# Patient Record
Sex: Female | Born: 1975 | Race: White | Hispanic: No | Marital: Married
Health system: Southern US, Community
[De-identification: ages and names within clinical notes are randomized; demographics above are authoritative.]

---

## 2016-08-16 DIAGNOSIS — R001 Bradycardia, unspecified: Secondary | ICD-10-CM | POA: Insufficient documentation

## 2016-08-16 DIAGNOSIS — F411 Generalized anxiety disorder: Secondary | ICD-10-CM | POA: Insufficient documentation

## 2016-09-26 DIAGNOSIS — E669 Obesity, unspecified: Secondary | ICD-10-CM | POA: Insufficient documentation

## 2021-01-05 NOTE — Progress Notes (Signed)
[place] Phone: [phone] Subjective:   Rebecca [name], am serving as a scribe for Dr. Hulan Saas. This visit occurred during the SARS-CoV-2 public health emergency.  Safety protocols were in place, including screening questions prior to the visit, additional usage of staff PPE, and extensive cleaning of exam room while observing appropriate contact time as indicated for disinfecting solutions.   I'm seeing this patient by the request  of:  Katherina Mires, MD  CC: Right knee pain  [id number]  Rebecca [name] is a 45 y.o. female coming in with complaint of R knee and toe pain. States that she has history of R femur fx 20 years ago. Does have rod in femur. Has tried PRP injection one year ago but this did not help. Steroid or gel injections have also not provided any relief. Patient has also tried custom OA braces. Pain over medial aspect. Patient is a Physicist, medical. Patient uses Motrin on long runs.    Patient also having pain over 2nd and 3rd met head. Patient saw PCP and they discussed possible Morton's Neuroma.     [name] past medical history on file.  Social History   Socioeconomic History   Marital status: Married    Spouse name: Not on file   Number of children: Not on file   Years of education: Not on file   Highest education level: Not on file  Occupational History   Not on file  Tobacco Use   Smoking status: Not on file   Smokeless tobacco: Not on file  Substance and Sexual Activity   Alcohol use: Not on file   Drug use: Not on file   Sexual activity: Not on file  Other Topics Concern   Not on file  Social History Narrative   Not on file   Social Determinants of Health   Financial Resource Strain: Not on file  Food Insecurity: Not on file  Transportation Needs: Not on file  Physical Activity: Not on file  Stress: Not on file  Social Connections: Not on file   Not on File [name] family  history on file.       Current Outpatient Medications (Other):    escitalopram (LEXAPRO) 20 MG tablet, Take 20 mg by mouth daily.   Vitamin D, Cholecalciferol, 10 MCG (400 UNIT) CAPS, Take by mouth.   Reviewed prior external information including notes and imaging from  primary care provider As well as notes that were available from care everywhere and other healthcare systems.  Past medical history, social, surgical and family history all reviewed in electronic medical record.  [name] pertanent information unless stated regarding to the chief complaint.   Review of Systems:  [name] headache, visual changes, nausea, vomiting, diarrhea, constipation, dizziness, abdominal pain, skin rash, fevers, chills, night sweats, weight loss, swollen lymph nodes, body aches, joint swelling, chest pain, shortness of breath, mood changes. POSITIVE muscle aches  Objective  Blood pressure 118/74, pulse 83, height '5\' 5"'  (1.651 m), weight 168 lb (76.2 kg), last menstrual period 12/23/2020, SpO2 98 %.   General: [name] apparent distress alert and oriented x3 mood and affect normal, dressed appropriately.  HEENT: Pupils equal, extraocular movements intact  Respiratory: Patient's speak in full sentences and does not appear short of breath  Cardiovascular: [name] lower extremity edema, non tender, [name] erythema  Gait normal with good balance and coordination.  MSK: Right knee exam shows the patient does have mild lateral tracking  of the patella noted.  Patient actually has fairly good stability.  More tenderness over the femur itself.  [name] erythema, trace effusion noted the patellofemoral joint.  Foot exam shows the patient does have significant breakdown of the transverse arch.  Patient does have splaying between the second and third toes.  Relatively normal longitudinal foot.  Very slight overpronation noted.  Limited muscular skeletal ultrasound was performed and interpreted by Hulan Saas, M  Limited musculoskeletal  ultrasound shows that patient's distal femur on the lateral aspect does have what appears to be postsurgical changes noted.  Mild hypoechoic changes in this area underneath the muscle.  Patient does have mild to moderate narrowing of the patellofemoral joint with trace effusion noted.  Patient is some medial and lateral space though seems to be unremarkable with very mild arthritic changes.  [name] significant acute meniscal injury is noted. 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

## 2021-01-06 ENCOUNTER — Ambulatory Visit (INDEPENDENT_AMBULATORY_CARE_PROVIDER_SITE_OTHER): Payer: BC Managed Care – PPO

## 2021-01-06 ENCOUNTER — Other Ambulatory Visit: Payer: Self-pay

## 2021-01-06 ENCOUNTER — Ambulatory Visit (INDEPENDENT_AMBULATORY_CARE_PROVIDER_SITE_OTHER): Payer: BC Managed Care – PPO | Admitting: Family Medicine

## 2021-01-06 ENCOUNTER — Other Ambulatory Visit: Payer: Self-pay | Admitting: Family Medicine

## 2021-01-06 VITALS — BP 118/74 | HR 83 | Ht 65.0 in | Wt 168.0 lb

## 2021-01-06 DIAGNOSIS — M216X9 Other acquired deformities of unspecified foot: Secondary | ICD-10-CM | POA: Diagnosis not present

## 2021-01-06 DIAGNOSIS — M1711 Unilateral primary osteoarthritis, right knee: Secondary | ICD-10-CM

## 2021-01-06 DIAGNOSIS — M25561 Pain in right knee: Secondary | ICD-10-CM

## 2021-01-06 NOTE — Patient Instructions (Signed)
Thigh compression sleeve BODY [email or web address] Tru pull lite R knee Spenco Total Support HOKA Arahi shoes OOFOS in house Vit D 2000IU daily See me again in 6-8 weeks

## 2021-01-07 ENCOUNTER — Encounter: Payer: Self-pay | Admitting: Family Medicine

## 2021-01-07 DIAGNOSIS — M1711 Unilateral primary osteoarthritis, right knee: Secondary | ICD-10-CM | POA: Insufficient documentation

## 2021-01-07 DIAGNOSIS — M216X9 Other acquired deformities of unspecified foot: Secondary | ICD-10-CM | POA: Insufficient documentation

## 2021-01-07 NOTE — Assessment & Plan Note (Signed)
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  Discussed icing regimen and home exercises, discussed avoiding certain activities.  Patient given a Tru pull lite brace for more stability of the patellofemoral being that this seems to be the most consistent with patient's pattern.  Concern now that there is some potential abnormality noted of the femur rod patient has had for years.  Discussed thigh compression, home exercises, proper shoes.  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  Follow-up with me again in 6 to 8 weeks

## 2021-01-07 NOTE — Assessment & Plan Note (Signed)
Breakdown of the transverse arch bilaterally.  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  Discussed potential medications including gabapentin as well as we may need to consider the potential for injections of follow-up.  Patient will start with over-the-counter orthotics, different shoes for running.  We will see how patient responds and follow-up again in 6 weeks

## 2021-02-17 ENCOUNTER — Ambulatory Visit: Payer: BC Managed Care – PPO | Admitting: Family Medicine

## 2021-03-06 NOTE — Progress Notes (Signed)
[name] [address] [place] [address] Phone: [phone] Subjective:   Rebecca [name], am serving as a scribe for Dr. Antoine Primas. This visit occurred during the SARS-CoV-2 public health emergency.  Safety protocols were in place, including screening questions prior to the visit, additional usage of staff PPE, and extensive cleaning of exam room while observing appropriate contact time as indicated for disinfecting solutions.   I'm seeing this patient by the request  of:  Macy Mis, MD  CC: Knee pain and foot pain  [id number]  01/06/2021 Breakdown of the transverse arch bilaterally.  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  Discussed potential medications including gabapentin as well as we may need to consider the potential for injections of follow-up.  Patient will start with over-the-counter orthotics, different shoes for running.  We will see how patient responds and follow-up again in 6 weeks  Update 03/07/2021 Rebecca [name] is a 45 y.o. female coming in with complaint of R knee and R foot pain. Patient states that her foot is improving. Is able to run further but does still experience pain. Using HOKA and orthotics. Pain is burning in nature when she has pain.   Wearing brace for knee pain. Continued soreness on medial aspect. Has not been able to do HEP as much due to husband breaking his foot.       No past medical history on file. No past surgical history on file. Social History   Socioeconomic History   Marital status: Married    Spouse name: Not on file   Number of children: Not on file   Years of education: Not on file   Highest education level: Not on file  Occupational History   Not on file  Tobacco Use   Smoking status: Not on file   Smokeless tobacco: Not on file  Substance and Sexual Activity   Alcohol use: Not on file   Drug use: Not on file   Sexual  activity: Not on file  Other Topics Concern   Not on file  Social History Narrative   Not on file   Social Determinants of Health   Financial Resource Strain: Not on file  Food Insecurity: Not on file  Transportation Needs: Not on file  Physical Activity: Not on file  Stress: Not on file  Social Connections: Not on file   Not on File No family history on file.       Current Outpatient Medications (Other):    escitalopram (LEXAPRO) 20 MG tablet, Take 20 mg by mouth daily.   Vitamin D, Cholecalciferol, 10 MCG (400 UNIT) CAPS, Take by mouth.   Reviewed prior external information including notes and imaging from  primary care provider As well as notes that were available from care everywhere and other healthcare systems.  Past medical history, social, surgical and family history all reviewed in electronic medical record.  No pertanent information unless stated regarding to the chief complaint.   Review of Systems:  No headache, visual changes, nausea, vomiting, diarrhea, constipation, dizziness, abdominal pain, skin rash, fevers, chills, night sweats, weight loss, swollen lymph nodes, body aches, joint swelling, chest pain, shortness of breath, mood changes. POSITIVE muscle aches  Objective  Blood pressure 104/72, pulse 65, height 5\' 5"  (1.651 m), weight 165 lb (74.8 kg), SpO2 97 %.   General: No apparent distress alert and oriented x3 mood and affect normal, dressed appropriately.  HEENT: Pupils equal, extraocular movements intact  Respiratory: Patient's speak in full sentences and does not appear short of breath  Cardiovascular: No lower extremity edema, non tender, no erythema  Gait normal with good balance and coordination.  MSK: Right knee exam shows very slight lateral tracking of the patella noted.  Patient does have very mild pain over this area.  No significant instability noted of the knee. Foot exam does show the breakdown of the transverse arch.  Patient has had  the squeeze test noted.   Limited muscular skeletal ultrasound was performed and interpreted by Antoine Primas, M  Limited musculoskeletal ultrasound shows patient does have trace effusion noted of the patellofemoral joint and mild to moderate narrowing noted.   Impression and Recommendations:     The above documentation has been reviewed and is accurate and complete Judi Saa, DO

## 2021-03-07 ENCOUNTER — Ambulatory Visit (INDEPENDENT_AMBULATORY_CARE_PROVIDER_SITE_OTHER): Payer: BC Managed Care – PPO | Admitting: Family Medicine

## 2021-03-07 ENCOUNTER — Encounter: Payer: Self-pay | Admitting: Family Medicine

## 2021-03-07 ENCOUNTER — Other Ambulatory Visit: Payer: Self-pay

## 2021-03-07 ENCOUNTER — Ambulatory Visit: Payer: Self-pay

## 2021-03-07 VITALS — BP 104/72 | HR 65 | Ht 65.0 in | Wt 165.0 lb

## 2021-03-07 DIAGNOSIS — M216X9 Other acquired deformities of unspecified foot: Secondary | ICD-10-CM

## 2021-03-07 DIAGNOSIS — M1711 Unilateral primary osteoarthritis, right knee: Secondary | ICD-10-CM | POA: Diagnosis not present

## 2021-03-07 DIAGNOSIS — M79671 Pain in right foot: Secondary | ICD-10-CM

## 2021-03-07 NOTE — Patient Instructions (Signed)
Foot exercises Knee is better See me in 2-3 months

## 2021-03-07 NOTE — Assessment & Plan Note (Signed)
Patient is doing much better overall at this time.  Only a trace effusion noted on the ultrasound today.  I believe patient will continue to do well with conservative therapy.  Can wear the brace more on an as-needed basis.  Patient's goal is to be able to continue the a half marathon at the age of 41.  Patient will follow-up with me again in 2 to 3 months

## 2021-03-07 NOTE — Assessment & Plan Note (Signed)
Patient given exercises today and I think will do relatively well.  Discussed home exercises and proper shoes again.  Patient has made improvement.  Patient is not working out as much secondary to her husband being injured recently.  Follow-up with me again 2 to 3 months and will progress patient as tolerated.

## 2021-05-11 NOTE — Progress Notes (Signed)
[place] Phone: [phone] Subjective:   Fontaine No, am serving as a scribe for Dr. Hulan Saas. This visit occurred during the SARS-CoV-2 public health emergency.  Safety protocols were in place, including screening questions prior to the visit, additional usage of staff PPE, and extensive cleaning of exam room while observing appropriate contact time as indicated for disinfecting solutions.  I'm seeing this patient by the request  of:  Katherina Mires, MD  CC: Left shoulder pain  [id number]  Florette Colaianni is a 46 y.o. female coming in with complaint of L shoulder pain. Last seen in November for foot pain. Patient states that she has pain in L shoulder with taking off bra and shirts for past 2 months. Rest for pain relief.      No past medical history on file. No past surgical history on file. Social History   Socioeconomic History   Marital status: Married    Spouse name: Not on file   Number of children: Not on file   Years of education: Not on file   Highest education level: Not on file  Occupational History   Not on file  Tobacco Use   Smoking status: Not on file   Smokeless tobacco: Not on file  Substance and Sexual Activity   Alcohol use: Not on file   Drug use: Not on file   Sexual activity: Not on file  Other Topics Concern   Not on file  Social History Narrative   Not on file   Social Determinants of Health   Financial Resource Strain: Not on file  Food Insecurity: Not on file  Transportation Needs: Not on file  Physical Activity: Not on file  Stress: Not on file  Social Connections: Not on file   Not on File No family history on file.       Current Outpatient Medications (Other):    escitalopram (LEXAPRO) 20 MG tablet, Take 20 mg by mouth daily.   Vitamin D, Cholecalciferol, 10 MCG (400 UNIT) CAPS, Take by mouth.   Reviewed prior external information including  notes and imaging from  primary care provider As well as notes that were available from care everywhere and other healthcare systems.  Past medical history, social, surgical and family history all reviewed in electronic medical record.  No pertanent information unless stated regarding to the chief complaint.   Review of Systems:  No headache, visual changes, nausea, vomiting, diarrhea, constipation, dizziness, abdominal pain, skin rash, fevers, chills, night sweats, weight loss, swollen lymph nodes, body aches, joint swelling, chest pain, shortness of breath, mood changes. POSITIVE muscle aches  Objective  Blood pressure 124/82, pulse 87, height 5\' 5"  (1.651 m), weight 182 lb (82.6 kg), last menstrual period 05/02/2021, SpO2 99 %.   General: No apparent distress alert and oriented x3 mood and affect normal, dressed appropriately.  HEENT: Pupils equal, extraocular movements intact  Respiratory: Patient's speak in full sentences and does not appear short of breath  Cardiovascular: No lower extremity edema, non tender, no erythema  Left shoulder exam does have some loss of lordosis.  Some tenderness to palpation in the paraspinal musculature.  Patient does have bone pain with crossover test.  Mild pain with O'Brien's.  Patient does have good range of motion of the shoulder noted.  5 out of 5 strength of the rotator cuff.  Limited muscular skeletal ultrasound was performed and interpreted by Hulan Saas, M  Limited  ultrasound of patient's left shoulder shows the patient's rotator cuff does appear to be intact.  Mild hypoechoic changes that is consistent with tendinitis of the supraspinatus and the subscapularis.  Patient does have hypoechoic changes and does have narrowing of the acromioclavicular joint noted. 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  [id number]; 15 additional minutes spent for Therapeutic exercises as stated in above notes.  This included exercises focusing on  stretching, strengthening, with significant focus on eccentric aspects.   Long term goals include an improvement in range of motion, strength, endurance as well as avoiding reinjury. Patient's frequency would include in 1-2 times a day, 3-5 times a week for a duration of 6-12 weeks.  Shoulder Exercises that included:  Basic scapular stabilization to include adduction and depression of scapula Scaption, focusing on proper movement and good control Internal and External rotation utilizing a theraband, with elbow tucked at side entire time Rows with theraband  Proper technique shown and discussed handout in great detail with ATC.  All questions were discussed and answered.      Impression and Recommendations:     The above documentation has been reviewed and is accurate and complete Lyndal Pulley, DO

## 2021-05-12 ENCOUNTER — Ambulatory Visit: Payer: Self-pay

## 2021-05-12 ENCOUNTER — Encounter: Payer: Self-pay | Admitting: Family Medicine

## 2021-05-12 ENCOUNTER — Ambulatory Visit (INDEPENDENT_AMBULATORY_CARE_PROVIDER_SITE_OTHER): Payer: BC Managed Care – PPO | Admitting: Family Medicine

## 2021-05-12 ENCOUNTER — Other Ambulatory Visit: Payer: Self-pay

## 2021-05-12 ENCOUNTER — Ambulatory Visit (INDEPENDENT_AMBULATORY_CARE_PROVIDER_SITE_OTHER): Payer: BC Managed Care – PPO

## 2021-05-12 VITALS — BP 124/82 | HR 87 | Ht 65.0 in | Wt 182.0 lb

## 2021-05-12 DIAGNOSIS — M19012 Primary osteoarthritis, left shoulder: Secondary | ICD-10-CM | POA: Insufficient documentation

## 2021-05-12 DIAGNOSIS — M25512 Pain in left shoulder: Secondary | ICD-10-CM

## 2021-05-12 NOTE — Assessment & Plan Note (Signed)
Patient does have some pain over the leftAC joint.  Patient does have some hypoechoic changes noted on ultrasound.  We will get x-rays.  Given home exercises, will do icing regimen and topical anti-inflammatories.  Given exercises.  Follow-up with me again in 4 to 6 weeks.  Worsening pain consider formal physical therapy or the possibility of injections.

## 2021-05-12 NOTE — Patient Instructions (Addendum)
Xray today Exercises 3x a week Ice 20 min 2x a day Voltaren gel Keep hands in peripheral vision Keep other appt

## 2021-06-08 NOTE — Progress Notes (Signed)
°  [place] Phone: [phone] Subjective:   Rebecca Rebecca [name], am serving as a scribe for Dr. Hulan [name]. This visit occurred during the SARS-CoV-2 public health emergency.  Safety protocols were in place, including screening questions prior to the visit, additional usage of staff PPE, and extensive cleaning of exam room while observing appropriate contact time as indicated for disinfecting solutions.   I'm seeing this patient by the request  of:  Rebecca Mires, MD  CC: left shoulder pain   [id number]  05/12/2021 Patient does have some pain over the leftAC joint.  Patient does have some hypoechoic changes noted on ultrasound.  We will get x-rays.  Given home exercises, will do icing regimen and topical anti-inflammatories.  Given exercises.  Follow-up with me again in 4 to 6 weeks.  Worsening pain consider formal physical therapy or the possibility of injections.  Updated 06/09/2021 Rebecca Rebecca [name] is a 46 y.o. female coming in with complaint of shoulder pain. She is doing better. Able to take clothes without pain.   Xray (-)     Rebecca [name] past medical history on file. Rebecca [name] past surgical history on file. Social History   Socioeconomic History   Marital status: Married    Spouse name: Not on file   Number of children: Not on file   Years of education: Not on file   Highest education level: Not on file  Occupational History   Not on file  Tobacco Use   Smoking status: Not on file   Smokeless tobacco: Not on file  Substance and Sexual Activity   Alcohol use: Not on file   Drug use: Not on file   Sexual activity: Not on file  Other Topics Concern   Not on file  Social History Narrative   Not on file   Social Determinants of Health   Financial Resource Strain: Not on file  Food Insecurity: Not on file  Transportation Needs: Not on file  Physical Activity: Not on file  Stress: Not on file  Social Connections:  Not on file   Not on File Rebecca [name] family history on file.       Current Outpatient Medications (Other):    escitalopram (LEXAPRO) 20 MG tablet, Take 20 mg by mouth daily.   Vitamin D, Cholecalciferol, 10 MCG (400 UNIT) CAPS, Take by mouth.     Objective  Blood pressure 114/66, pulse 63, height 5\' 5"  (1.651 m), weight 182 lb (82.6 kg), SpO2 99 %.   General: Rebecca [name] apparent distress alert and oriented x3 mood and affect normal, dressed appropriately.  HEENT: Pupils equal, extraocular movements intact  Respiratory: Patient's speak in full sentences and does not appear short of breath  Cardiovascular: Rebecca [name] lower extremity edema, non tender, Rebecca [name] erythema  Gait normal with good balance and coordination.  MSK: Left shoulder exam has significant improvement in range of motion.  Very mild positive discomfort still with crossover but otherwise fairly unremarkable.    Impression and Recommendations:     The above documentation has been reviewed and is accurate and complete Rebecca Pulley, DO

## 2021-06-09 ENCOUNTER — Ambulatory Visit (INDEPENDENT_AMBULATORY_CARE_PROVIDER_SITE_OTHER): Payer: BC Managed Care – PPO | Admitting: Family Medicine

## 2021-06-09 ENCOUNTER — Other Ambulatory Visit: Payer: Self-pay

## 2021-06-09 ENCOUNTER — Ambulatory Visit: Payer: Self-pay

## 2021-06-09 VITALS — BP 114/66 | HR 63 | Ht 65.0 in | Wt 182.0 lb

## 2021-06-09 DIAGNOSIS — M19012 Primary osteoarthritis, left shoulder: Secondary | ICD-10-CM

## 2021-06-09 DIAGNOSIS — G8929 Other chronic pain: Secondary | ICD-10-CM

## 2021-06-09 DIAGNOSIS — M25512 Pain in left shoulder: Secondary | ICD-10-CM | POA: Diagnosis not present

## 2021-06-09 NOTE — Assessment & Plan Note (Signed)
Patient is doing much better at this time.  Held on doing any type of ultrasound with [place] and likely not changing any medical management.  As long as patient does well she can follow-up as needed

## 2021-06-09 NOTE — Patient Instructions (Signed)
Ok to push shoulder and knee Let's have appt in 2 months just in case

## 2021-08-17 NOTE — Progress Notes (Signed)
?Terrilee Files D.O. ?[facility] ?[address] [place] [address] ?Phone: [phone] ?Subjective:   ?I, [name], am serving as a scribe for Dr. Antoine Primas. ? ?This visit occurred during the SARS-CoV-2 public health emergency.  Safety protocols were in place, including screening questions prior to the visit, additional usage of staff PPE, and extensive cleaning of exam room while observing appropriate contact time as indicated for disinfecting solutions. 172lb ?I'm seeing this patient by the request  of:  Macy Mis, MD ? ?CC: ankle pain  ? ?[id number]  ?06/09/2021 ?Patient is doing much better at this time.  Held on doing any type of ultrasound with [place] and likely not changing any medical management.  As long as patient does well she can follow-up as needed ? ?Update 08/18/2021 ?Rebecca [name] is a 46 y.o. female coming in with complaint of L AC joint pain. Patient states that she stopped going to [employer] and feels like shoulder is doing much better.  ? ?Pain in L ankle for month. Pain over L lateral heel. Patient has been increasing running for tri training. Patient has pain with riding and then the foot is much more painful with the run.  ? ? ?  ? ?No past medical history on file. ?No past surgical history on file. ?Social History  ? ?Socioeconomic History  ? Marital status: Married  ?  Spouse name: Not on file  ? Number of children: Not on file  ? Years of education: Not on file  ? Highest education level: Not on file  ?Occupational History  ? Not on file  ?Tobacco Use  ? Smoking status: Not on file  ? Smokeless tobacco: Not on file  ?Substance and Sexual Activity  ? Alcohol use: Not on file  ? Drug use: Not on file  ? Sexual activity: Not on file  ?Other Topics Concern  ? Not on file  ?Social History Narrative  ? Not on file  ? ?Social Determinants of Health  ? ?Financial Resource Strain: Not on file  ?Food Insecurity: Not on file  ?Transportation Needs: Not on file  ?Physical  Activity: Not on file  ?Stress: Not on file  ?Social Connections: Not on file  ? ?Not on File ?No family history on file. ? ? ? ? ? ? ?Current Outpatient Medications (Other):  ?  escitalopram (LEXAPRO) 20 MG tablet, Take 20 mg by mouth daily. ?  Vitamin D, Cholecalciferol, 10 MCG (400 UNIT) CAPS, Take by mouth. ? ? ?Reviewed prior external information including notes and imaging from  ?primary care provider ?As well as notes that were available from care everywhere and other healthcare systems. ? ?Past medical history, social, surgical and family history all reviewed in electronic medical record.  No pertanent information unless stated regarding to the chief complaint.  ? ?Review of Systems: ? No headache, visual changes, nausea, vomiting, diarrhea, constipation, dizziness, abdominal pain, skin rash, fevers, chills, night sweats, weight loss, swollen lymph nodes, body aches, joint swelling, chest pain, shortness of breath, mood changes. POSITIVE muscle aches ? ?Objective  ?Blood pressure 110/78, pulse (!) 51, height 5\' 5"  (1.651 m), weight 172 lb (78 kg), SpO2 99 %. ?  ?General: No apparent distress alert and oriented x3 mood and affect normal, dressed appropriately.  ?HEENT: Pupils equal, extraocular movements intact  ?Respiratory: Patient's speak in full sentences and does not appear short of breath  ?Cardiovascular: No lower extremity edema, non tender, no erythema  ?Gait normal with good  balance and coordination.  ?MSK:  left ankle pain ttp over lateral aspect on the peroneal tendon Just inferior to the lateral malleolus.No significant swelling noted.No discoloration noted. ? ?Limited muscular skeletal ultrasound was performed and interpreted by Antoine Primas, M   ?Limited musculoskeletal ultrasound shows some mild hypoechoic changes at the peroneal tendons noted in the area of most tenderness.  No tearing noted.  Does have significant close proximity to the peroneal nerve ?Impression: Peroneal tendinitis no  tearing ? ?Procedure: Real-time Ultrasound Guided Injection of left peroneal tendon sheath ?Device: GE Logiq Q7 ?Ultrasound guided injection is preferred based studies that show increased duration, increased effect, greater accuracy, decreased procedural pain, increased response rate, and decreased cost with ultrasound guided versus blind injection.  ?Verbal informed consent obtained.  ?Time-out conducted.  ?Noted no overlying erythema, induration, or other signs of local infection.  ?Skin prepped in a sterile fashion.  ?Local anesthesia: Topical Ethyl chloride.  ?With sterile technique and under real time ultrasound guidance: With a 25-gauge half inch needle with 0.5 cc of 0.5% Marcaine as well as 0.5 cc of Kenalog 40 mg per mill. ?Completed without difficulty  ?Pain immediately improved suggesting accurate placement of the medication.  ?Advised to call if fevers/chills, erythema, induration, drainage, or persistent bleeding.  ?Impression: Technically successful ultrasound guided injection. ? ? ?97110; 15 additional minutes spent for Therapeutic exercises as stated in above notes.  This included exercises focusing on stretching, strengthening, with significant focus on eccentric aspects.   Long term goals include an improvement in range of motion, strength, endurance as well as avoiding reinjury. Patient's frequency would include in 1-2 times a day, 3-5 times a week for a duration of 6-12 weeks. Ankle strengthening that included:  ?Basic range of motion exercises to allow proper full motion at ankle ?Stretching of the lower leg and hamstrings  ?Theraband exercises for the lower leg - inversion, eversion, dorsiflexion and plantarflexion each to be completed with a theraband ?Balance exercises to increase proprioception ?Weight bearing exercises to increase strength and balance ? ? Proper technique shown and discussed handout in great detail with ATC.  All questions were discussed and answered.  ? ?  ?Impression and  Recommendations:  ?  ?The above documentation has been reviewed and is accurate and complete Judi Saa, DO ? ? ? ?

## 2021-08-18 ENCOUNTER — Ambulatory Visit (INDEPENDENT_AMBULATORY_CARE_PROVIDER_SITE_OTHER): Payer: BC Managed Care – PPO | Admitting: Family Medicine

## 2021-08-18 ENCOUNTER — Encounter: Payer: Self-pay | Admitting: Family Medicine

## 2021-08-18 ENCOUNTER — Ambulatory Visit: Payer: Self-pay

## 2021-08-18 VITALS — BP 110/78 | HR 51 | Ht 65.0 in | Wt 172.0 lb

## 2021-08-18 DIAGNOSIS — M7672 Peroneal tendinitis, left leg: Secondary | ICD-10-CM

## 2021-08-18 DIAGNOSIS — M25512 Pain in left shoulder: Secondary | ICD-10-CM

## 2021-08-18 NOTE — Assessment & Plan Note (Signed)
Patient given injection and normal significantly better and has some improvement.  Patient given a heel lift as well to put in some of her shoes.  Given different exercises that I think will be also beneficial.  Discussed icing regimen.  Follow-up again in 6 to 8 weeks. ?

## 2021-08-18 NOTE — Patient Instructions (Signed)
Good to see you! ?Good look in race ?Injection into peroneal today ?See you again in 6 weeks ?

## 2021-10-04 NOTE — Progress Notes (Signed)
[name] [address] [place] [address] Phone: [phone] Subjective:   Rebecca [name], am serving as a scribe for Dr. Antoine Primas.   I'm seeing this patient by the request  of:  Macy Mis, MD  CC: Ankle pain follow-up  [id number]  08/18/2021 Patient given injection and normal significantly better and has some improvement.  Patient given a heel lift as well to put in some of her shoes.  Given different exercises that I think will be also beneficial.  Discussed icing regimen.  Follow-up again in 6 to 8 weeks.  Updated 10/05/2021 Rebecca [name] is a 46 y.o. female coming in with complaint of L ankle and L shoulder pain. Patient states that she started doing Burn yesterday and shoulder has been good.   Continued pain in lateral aspect of calcaneous with no pattern to her pain. Raced Olympic T on Mother's Day. Has only ran one time since that day. Has been able to spin without pain. Using new insoles for cleats. No worse but pain is not improving.   Also complains of medial joint line pain in R knee. No pattern to her pain.       No past medical history on file. No past surgical history on file.       Current Outpatient Medications (Other):    escitalopram (LEXAPRO) 20 MG tablet, Take 20 mg by mouth daily.   Vitamin D, Cholecalciferol, 10 MCG (400 UNIT) CAPS, Take by mouth.   Reviewed prior external information including notes and imaging from  primary care provider As well as notes that were available from care everywhere and other healthcare systems.  Past medical history, social, surgical and family history all reviewed in electronic medical record.  No pertanent information unless stated regarding to the chief complaint.   Review of Systems:  No headache, visual changes, nausea, vomiting, diarrhea, constipation, dizziness, abdominal pain, skin rash, fevers, chills, night sweats, weight loss, swollen lymph nodes, body aches,   chest pain, shortness of breath, mood changes. POSITIVE muscle aches, joint swelling  Objective  Blood pressure 124/82, pulse (!) 56, height 5\' 5"  (1.651 m), weight 175 lb (79.4 kg), SpO2 98 %.   General: No apparent distress alert and oriented x3 mood and affect normal, dressed appropriately.  HEENT: Pupils equal, extraocular movements intact  Respiratory: Patient's speak in full sentences and does not appear short of breath  Cardiovascular: No lower extremity edema, non tender, no erythema  Gait normal with good balance and coordination.  MSK: Left ankle exam shows patient still has some swelling on the right anterior lower malleolus area.  Does have some mild pain over the peroneal tendon as well.  Lacks last 5 degrees of extension of the ankle.  Right knee exam does have some tenderness over the medial joint line.  Good range of motion.  Limited muscular skeletal ultrasound was performed and interpreted by , M  Limited ultrasound of patient's ankle on the right side shows the patient does have some hypoechoic changes within the tendon sheath of the peroneal tendon.  Near the insertion of the fifth metatarsal questionable scar tissue formation or other hyperechoic changes that could be consistent with a chronic tear.  Patient's ankle mortise also has some hypoechoic changes that cannot tell if it is cyst or if it is more of an effusion. Impression: Continued abnormality of the ankle    Impression and Recommendations:     The above documentation has been  reviewed and is accurate and complete Lyndal Pulley, DO

## 2021-10-05 ENCOUNTER — Ambulatory Visit (INDEPENDENT_AMBULATORY_CARE_PROVIDER_SITE_OTHER): Payer: BC Managed Care – PPO

## 2021-10-05 ENCOUNTER — Ambulatory Visit: Payer: Self-pay

## 2021-10-05 ENCOUNTER — Ambulatory Visit (INDEPENDENT_AMBULATORY_CARE_PROVIDER_SITE_OTHER): Payer: BC Managed Care – PPO | Admitting: Family Medicine

## 2021-10-05 VITALS — BP 124/82 | HR 56 | Ht 65.0 in | Wt 175.0 lb

## 2021-10-05 DIAGNOSIS — M19012 Primary osteoarthritis, left shoulder: Secondary | ICD-10-CM | POA: Diagnosis not present

## 2021-10-05 DIAGNOSIS — G8929 Other chronic pain: Secondary | ICD-10-CM

## 2021-10-05 DIAGNOSIS — M25512 Pain in left shoulder: Secondary | ICD-10-CM | POA: Diagnosis not present

## 2021-10-05 DIAGNOSIS — M25572 Pain in left ankle and joints of left foot: Secondary | ICD-10-CM

## 2021-10-05 DIAGNOSIS — M7672 Peroneal tendinitis, left leg: Secondary | ICD-10-CM

## 2021-10-05 DIAGNOSIS — M1711 Unilateral primary osteoarthritis, right knee: Secondary | ICD-10-CM

## 2021-10-05 NOTE — Assessment & Plan Note (Signed)
Completely resolved at this time. 

## 2021-10-05 NOTE — Patient Instructions (Addendum)
Xray today MRI L ankle [id number] Make appt in 6 weeks

## 2021-10-05 NOTE — Assessment & Plan Note (Signed)
Patient had an injection but did not notice a significant improvement.  Patient was able to elected to do an Olympic triathlon.  Unfortunately now having the same discomfort in the ankle and is never without some type of discomfort would like to see if there is any significant tearing or any intra-articular process that is continuing to give her difficulty.  Patient like I said he continues to have pain with daily basis and is affecting some daily activities even though patient was able to finish the race.  Failed everything including formal physical therapy, home exercises, and anti-inflammatories as well as injection.

## 2021-10-05 NOTE — Assessment & Plan Note (Signed)
Seems to be doing relatively well at this moment.  No significant effusion noted today though.  Patient at this moment has been able to do biking without any difficulty. We will continue to monitor.  Can always consider advanced imaging but I do think patient will do well with this.  Could be also compensating for the contralateral side.  I also believe that there is a chance that there is some hamstring tightness that could be contributing to

## 2021-10-08 ENCOUNTER — Ambulatory Visit
Admission: RE | Admit: 2021-10-08 | Discharge: 2021-10-08 | Disposition: A | Discharge status: Home / Self Care | Payer: BC Managed Care – PPO | Source: Ambulatory Visit | Attending: Family Medicine | Admitting: Family Medicine

## 2021-10-08 DIAGNOSIS — G8929 Other chronic pain: Secondary | ICD-10-CM

## 2021-10-12 ENCOUNTER — Encounter: Payer: Self-pay | Admitting: Family Medicine

## 2021-10-25 NOTE — Progress Notes (Unsigned)
[name] [address] [place] [address] Phone: [phone] Subjective:   Rebecca [name], am serving as a scribe for Dr. Antoine Primas.  I'm seeing this patient by the request  of:  Macy Mis, MD  CC: Ankle pain follow-up  [id number]  10/05/2021 Seems to be doing relatively well at this moment.  No significant effusion noted today though.  Patient at this moment has been able to do biking without any difficulty. We will continue to monitor.  Can always consider advanced imaging but I do think patient will do well with this.  Could be also compensating for the contralateral side.  I also believe that there is a chance that there is some hamstring tightness that could be contributing to  Patient had an injection but did not notice a significant improvement.  Patient was able to elected to do an Olympic triathlon.  Unfortunately now having the same discomfort in the ankle and is never without some type of discomfort would like to see if there is any significant tearing or any intra-articular process that is continuing to give her difficulty.  Patient like I said he continues to have pain with daily basis and is affecting some daily activities even though patient was able to finish the race.  Failed everything including formal physical therapy, home exercises, and anti-inflammatories as well as injection.  Rebecca [name] is a 46 y.o. female coming in with complaint of ankle pain. Review MRI. Still same amount of pain despite resting. No other complaints  Xray IMPRESSION: 1. Chronic LEFT distal fibula deformity. 2. No acute fracture, dislocation or significant degenerative change within LEFT ankle. If concern for ligamentous injury consider MRI ankle for further evaluation.  MRI IMPRESSION: 1. Mild thickening of the medial band of the plantar fascia with subcortical reactive marrow edema at the calcaneal insertion consistent with plantar  fasciitis. 2. Mild tendinosis of the peroneus longus.     No past medical history on file. No past surgical history on file. Social History   Socioeconomic History   Marital status: Married    Spouse name: Not on file   Number of children: Not on file   Years of education: Not on file   Highest education level: Not on file  Occupational History   Not on file  Tobacco Use   Smoking status: Not on file   Smokeless tobacco: Not on file  Substance and Sexual Activity   Alcohol use: Not on file   Drug use: Not on file   Sexual activity: Not on file  Other Topics Concern   Not on file  Social History Narrative   Not on file   Social Determinants of Health   Financial Resource Strain: Not on file  Food Insecurity: Not on file  Transportation Needs: Not on file  Physical Activity: Not on file  Stress: Not on file  Social Connections: Not on file   Not on File No family history on file.       Current Outpatient Medications (Other):    escitalopram (LEXAPRO) 20 MG tablet, Take 20 mg by mouth daily.   Vitamin D, Cholecalciferol, 10 MCG (400 UNIT) CAPS, Take by mouth.   Reviewed prior external information including notes and imaging from  primary care provider As well as notes that were available from care everywhere and other healthcare systems.  Past medical history, social, surgical and family history all reviewed in electronic medical record.  No pertanent information unless  stated regarding to the chief complaint.   Review of Systems:  No headache, visual changes, nausea, vomiting, diarrhea, constipation, dizziness, abdominal pain, skin rash, fevers, chills, night sweats, weight loss, swollen lymph nodes, body aches, joint swelling, chest pain, shortness of breath, mood changes. POSITIVE muscle aches  Objective  Blood pressure 118/68, pulse 96, weight 179 lb (81.2 kg), last menstrual period 10/01/2021, SpO2 99 %.   General: No apparent distress alert and  oriented x3 mood and affect normal, dressed appropriately.  HEENT: Pupils equal, extraocular movements intact  Respiratory: Patient's speak in full sentences and does not appear short of breath  Cardiovascular: No lower extremity edema, non tender, no erythema  Left ankle still tender to palpation mostly over the lateral aspect.  Seems to be over the peroneal tendons are noted.  Seems to be posterior and inferior to the malleolus.  Patient has no significant instability noted.  Procedure: Real-time Ultrasound Guided Injection of peroneal longus tendon sheath Device: GE Logiq Q7 Ultrasound guided injection is preferred based studies that show increased duration, increased effect, greater accuracy, decreased procedural pain, increased response rate, and decreased cost with ultrasound guided versus blind injection.  Verbal informed consent obtained.  Time-out conducted.  Noted no overlying erythema, induration, or other signs of local infection.  Skin prepped in a sterile fashion.  Local anesthesia: Topical Ethyl chloride.  With sterile technique and under real time ultrasound guidance: With a 25-gauge half inch needle injecting 0.5 cc of 0.5% Marcaine and 0.5 cc of Kenalog 40 mg/mL Completed without difficulty  Pain immediately improved suggesting accurate placement of the medication.  Advised to call if fevers/chills, erythema, induration, drainage, or persistent bleeding.  Impression: Technically successful ultrasound guided injection.    Impression and Recommendations:     The above documentation has been reviewed and is accurate and complete Judi Saa, DO

## 2021-10-26 ENCOUNTER — Ambulatory Visit (INDEPENDENT_AMBULATORY_CARE_PROVIDER_SITE_OTHER): Payer: BC Managed Care – PPO | Admitting: Family Medicine

## 2021-10-26 ENCOUNTER — Ambulatory Visit: Payer: Self-pay

## 2021-10-26 VITALS — BP 118/68 | HR 96 | Wt 179.0 lb

## 2021-10-26 DIAGNOSIS — M7672 Peroneal tendinitis, left leg: Secondary | ICD-10-CM

## 2021-10-26 NOTE — Patient Instructions (Addendum)
Injection in Peroneal today Do prescribed exercises at least 3x a week See you again in 6-8 weeks

## 2021-10-26 NOTE — Assessment & Plan Note (Signed)
Patient given injection today.  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  Hoping the patient does not respond more at that time.  Discussed posture and ergonomics otherwise.  Follow-up again in 6 to 8-week

## 2021-11-16 ENCOUNTER — Encounter: Payer: Self-pay | Admitting: Family Medicine

## 2021-11-23 NOTE — Progress Notes (Deleted)
  Rebecca [name] [address] [place] [address] Phone: [phone] Subjective:    I'm seeing this patient by the request  of:  Macy Mis, MD  CC:   [id number]  10/26/2021 Patient given injection today.  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  Hoping the patient does not respond more at that time.  Discussed posture and ergonomics otherwise.  Follow-up again in 6 to 8-week  Update 11/24/2021 Rebecca [name] is a 46 y.o. female coming in with complaint of L ankle pain. Patient wrote MyChart message that she has been doing well since getting injection and has been able to run a few times. Patient states       No past medical history on file. No past surgical history on file. Social History   Socioeconomic History   Marital status: Married    Spouse name: Not on file   Number of children: Not on file   Years of education: Not on file   Highest education level: Not on file  Occupational History   Not on file  Tobacco Use   Smoking status: Not on file   Smokeless tobacco: Not on file  Substance and Sexual Activity   Alcohol use: Not on file   Drug use: Not on file   Sexual activity: Not on file  Other Topics Concern   Not on file  Social History Narrative   Not on file   Social Determinants of Health   Financial Resource Strain: Not on file  Food Insecurity: Not on file  Transportation Needs: Not on file  Physical Activity: Not on file  Stress: Not on file  Social Connections: Not on file   Not on File No family history on file.       Current Outpatient Medications (Other):    escitalopram (LEXAPRO) 20 MG tablet, Take 20 mg by mouth daily.   Vitamin D, Cholecalciferol, 10 MCG (400 UNIT) CAPS, Take by mouth.   Reviewed prior external information including notes and imaging from  primary care provider As well as notes that were available from care  everywhere and other healthcare systems.  Past medical history, social, surgical and family history all reviewed in electronic medical record.  No pertanent information unless stated regarding to the chief complaint.   Review of Systems:  No headache, visual changes, nausea, vomiting, diarrhea, constipation, dizziness, abdominal pain, skin rash, fevers, chills, night sweats, weight loss, swollen lymph nodes, body aches, joint swelling, chest pain, shortness of breath, mood changes. POSITIVE muscle aches  Objective  There were no vitals taken for this visit.   General: No apparent distress alert and oriented x3 mood and affect normal, dressed appropriately.  HEENT: Pupils equal, extraocular movements intact  Respiratory: Patient's speak in full sentences and does not appear short of breath  Cardiovascular: No lower extremity edema, non tender, no erythema      Impression and Recommendations:

## 2021-11-24 ENCOUNTER — Ambulatory Visit: Payer: BC Managed Care – PPO | Admitting: Family Medicine

## 2021-12-22 NOTE — Progress Notes (Unsigned)
  [name] [address] [place] [address] Phone: [phone] Subjective:   INadine Counts, am serving as a scribe for Dr. Antoine Primas.  I'm seeing this patient by the request  of:  Macy Mis, MD  CC: Left thigh pain follow-up  [id number]  10/26/2021 Patient given injection today.  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  Hoping the patient does not respond more at that time.  Discussed posture and ergonomics otherwise.  Follow-up again in 6 to 8-week  Update 12/25/2021 Rebecca [name] is a 46 y.o. female coming in with complaint of L ankle and L thigh pain.  2 months ago given injection in the peroneal tendon sheath.  Patient states injection did help. Since walking around [place] about 2 weeks ago has been a bit more bothersome. Thigh pain went away this weekend.       No past medical history on file. No past surgical history on file. Social History   Socioeconomic History   Marital status: Married    Spouse name: Not on file   Number of children: Not on file   Years of education: Not on file   Highest education level: Not on file  Occupational History   Not on file  Tobacco Use   Smoking status: Not on file   Smokeless tobacco: Not on file  Substance and Sexual Activity   Alcohol use: Not on file   Drug use: Not on file   Sexual activity: Not on file  Other Topics Concern   Not on file  Social History Narrative   Not on file   Social Determinants of Health   Financial Resource Strain: Not on file  Food Insecurity: Not on file  Transportation Needs: Not on file  Physical Activity: Not on file  Stress: Not on file  Social Connections: Not on file          Current Outpatient Medications (Other):    escitalopram (LEXAPRO) 20 MG tablet, Take 20 mg by mouth daily.   Vitamin D, Cholecalciferol, 10 MCG (400 UNIT) CAPS, Take by mouth.    Review  of Systems:  No headache, visual changes, nausea, vomiting, diarrhea, constipation, dizziness, abdominal pain, skin rash, fevers, chills, night sweats, weight loss, swollen lymph nodes, body aches, joint swelling, chest pain, shortness of breath, mood changes. POSITIVE muscle aches  Objective  Blood pressure 102/68, pulse 60, height 5\' 5"  (1.651 m), weight 184 lb (83.5 kg), SpO2 98 %.   General: No apparent distress alert and oriented x3 mood and affect normal, dressed appropriately.  HEENT: Pupils equal, extraocular movements intact  Respiratory: Patient's speak in full sentences and does not appear short of breath  Cardiovascular: No lower extremity edema, non tender, no erythema  Ankle exam shows still some mild tenderness over the peroneal tendon but improvement.  Full strength noted.  Exam shows hip has no significant difficulty whatsoever.  Able to walk without any significant problems.   Limited muscular skeletal ultrasound was performed and interpreted by , M   Limited ultrasound shows the patient does have some hypoechoic changes minimal overall on the left side. Impression: Interval improvement    Impression and Recommendations:    The above documentation has been reviewed and is accurate and complete Antoine Primas, DO

## 2021-12-25 ENCOUNTER — Ambulatory Visit: Payer: Self-pay

## 2021-12-25 ENCOUNTER — Encounter: Payer: Self-pay | Admitting: Family Medicine

## 2021-12-25 ENCOUNTER — Ambulatory Visit (INDEPENDENT_AMBULATORY_CARE_PROVIDER_SITE_OTHER): Payer: BC Managed Care – PPO | Admitting: Family Medicine

## 2021-12-25 VITALS — BP 102/68 | HR 60 | Ht 65.0 in | Wt 184.0 lb

## 2021-12-25 DIAGNOSIS — M7672 Peroneal tendinitis, left leg: Secondary | ICD-10-CM

## 2021-12-25 NOTE — Patient Instructions (Addendum)
Good to see you! Limit on the number of rock queries you jump off of Ankle looks good and can continue activities as tolerated

## 2021-12-25 NOTE — Assessment & Plan Note (Signed)
Patient is doing much better at this point.  Discussed with patient that we will continue to monitor.  Worsening symptoms will come back in discussed the possibility of advanced imaging of repeating the injection.

## 2022-03-16 ENCOUNTER — Ambulatory Visit: Payer: BC Managed Care – PPO | Admitting: Family Medicine

## 2022-04-11 NOTE — Progress Notes (Signed)
[name] [address] [place] [address] Phone: [phone] Subjective:   [name], am serving as a scribe for Dr. Antoine Primas.  I'm seeing this patient by the request  of:  Macy Mis, MD  CC: Left thigh pain  [id number]  8/282023 Patient is doing much better at this point.  Discussed with patient that we will continue to monitor.  Worsening symptoms will come back in discussed the possibility of advanced imaging of repeating the injection.     Update 04/13/2022 Rebecca [name] is a 46 y.o. female coming in with complaint of L ankle pain. Pain is moving up the leg over lateral aspect. Pain over distal fibula. She feels like peroneal tendon is catching on area where she broke her fibula. Pain will be sharp at times.   Ongoing pain in R knee with activity. Pain over medial aspect of knee.    No past medical history on file. No past surgical history on file. Social History   Socioeconomic History   Marital status: Married    Spouse name: Not on file   Number of children: Not on file   Years of education: Not on file   Highest education level: Not on file  Occupational History   Not on file  Tobacco Use   Smoking status: Not on file   Smokeless tobacco: Not on file  Substance and Sexual Activity   Alcohol use: Not on file   Drug use: Not on file   Sexual activity: Not on file  Other Topics Concern   Not on file  Social History Narrative   Not on file   Social Determinants of Health   Financial Resource Strain: Not on file  Food Insecurity: Not on file  Transportation Needs: Not on file  Physical Activity: Not on file  Stress: Not on file  Social Connections: Not on file   Not on File No family history on file.       Current Outpatient Medications (Other):    escitalopram (LEXAPRO) 20 MG tablet, Take 20 mg by mouth daily.   Vitamin D, Cholecalciferol, 10 MCG (400 UNIT) CAPS, Take by mouth.   Reviewed  prior external information including notes and imaging from  primary care provider As well as notes that were available from care everywhere and other healthcare systems.  Past medical history, social, surgical and family history all reviewed in electronic medical record.  No pertanent information unless stated regarding to the chief complaint.   Review of Systems:  No headache, visual changes, nausea, vomiting, diarrhea, constipation, dizziness, abdominal pain, skin rash, fevers, chills, night sweats, weight loss, swollen lymph nodes, body aches, joint swelling, chest pain, shortness of breath, mood changes. POSITIVE muscle aches  Objective  Blood pressure 108/70, pulse 68, height 5\' 5"  (1.651 m), weight 190 lb (86.2 kg), SpO2 99 %.   General: No apparent distress alert and oriented x3 mood and affect normal, dressed appropriately.  HEENT: Pupils equal, extraocular movements intact  Respiratory: Patient's speak in full sentences and does not appear short of breath  Cardiovascular: No lower extremity edema, non tender, no erythema  Right knee exam shows the patient does have significant instability of the right knee at the moment.  Tender to palpation over the medial joint line.  Patient does have crepitus noted as well.  Does have an antalgic gait noted. Left ankle does have tenderness to palpation over the anterior aspect.  Patient does have a fullness  noted more on the lateral aspect approximately 4 cm proximal.   Limited muscular skeletal ultrasound was performed and interpreted by Antoine Primas, M  Limited Ultrasound of Patient's Fullness on Her Anterior and Lateral Aspect of the calf does have an area that does appear to be more of a muscle injury.  Does have some increasing Doppler flow.  Patient does have what appears to be hyperechoic changes then going into the peroneal tendon that is consistent with potentially dried blood. Impression: Acute peroneal muscle tear with reactive  tenosynovitis of the peroneal tendon   Impression and Recommendations:     The above documentation has been reviewed and is accurate and complete Judi Saa, DO

## 2022-04-13 ENCOUNTER — Ambulatory Visit (INDEPENDENT_AMBULATORY_CARE_PROVIDER_SITE_OTHER): Payer: BC Managed Care – PPO

## 2022-04-13 ENCOUNTER — Ambulatory Visit: Payer: Self-pay

## 2022-04-13 ENCOUNTER — Ambulatory Visit (INDEPENDENT_AMBULATORY_CARE_PROVIDER_SITE_OTHER): Payer: BC Managed Care – PPO | Admitting: Family Medicine

## 2022-04-13 VITALS — BP 108/70 | HR 68 | Ht 65.0 in | Wt 190.0 lb

## 2022-04-13 DIAGNOSIS — G8929 Other chronic pain: Secondary | ICD-10-CM

## 2022-04-13 DIAGNOSIS — M79652 Pain in left thigh: Secondary | ICD-10-CM

## 2022-04-13 DIAGNOSIS — M79662 Pain in left lower leg: Secondary | ICD-10-CM

## 2022-04-13 DIAGNOSIS — M25561 Pain in right knee: Secondary | ICD-10-CM

## 2022-04-13 DIAGNOSIS — M1711 Unilateral primary osteoarthritis, right knee: Secondary | ICD-10-CM

## 2022-04-13 DIAGNOSIS — M7672 Peroneal tendinitis, left leg: Secondary | ICD-10-CM

## 2022-04-13 NOTE — Assessment & Plan Note (Signed)
Patient still has the hypermobility of the ankle to a certain degree.  Patient does have tenderness to palpation we discussed with patient though that it does appear that she does have a new injury to the proximal muscular tendon juncture.  We discussed with patient and if we needed to further evaluate we do need to consider the possibility of x-rays and even MRI.  Patient would like to the x-rays but hold on the MRI. Discussed with patient about compression and home exercises.  Follow-up with me again in 6 to 8 weeks.

## 2022-04-13 NOTE — Assessment & Plan Note (Signed)
Does have some degenerative arthritis but no instability of the knee.  Due to patient's age and the severity but it is affecting daily activities I do feel advanced imaging is warranted.  Patient has failed injections, formal physical therapy, oral and topical anti-inflammatories.  Depending on the findings we will discuss further treatment options.

## 2022-04-13 NOTE — Patient Instructions (Signed)
[place]aston tool and heat 1-2 x a day Wear compression with workouts MRI R knee [phone] We will be in touch  See me in 4-6 weeks to check lower leg

## 2022-05-11 NOTE — Progress Notes (Deleted)
[place] Phone: [phone] Subjective:    I'm seeing this patient by the request  of:  Katherina Mires, MD  CC:   [id number]  04/13/2022 Does have some degenerative arthritis but no instability of the knee. Due to patient's age and the severity but it is affecting daily activities I do feel advanced imaging is warranted. Patient has failed injections, formal physical therapy, oral and topical anti-inflammatories. Depending on the findings we will discuss further treatment options.   Patient still has the hypermobility of the ankle to a certain degree.  Patient does have tenderness to palpation we discussed with patient though that it does appear that she does have a new injury to the proximal muscular tendon juncture.  We discussed with patient and if we needed to further evaluate we do need to consider the possibility of x-rays and even MRI.  Patient would like to the x-rays but hold on the MRI. Discussed with patient about compression and home exercises.  Follow-up with me again in 6 to 8 weeks.      Update 05/17/2022 Rebecca [name] is a 47 y.o. female coming in with complaint of L ankle and R knee pain. Patient states        No past medical history on file. No past surgical history on file. Social History   Socioeconomic History   Marital status: Married    Spouse name: Not on file   Number of children: Not on file   Years of education: Not on file   Highest education level: Not on file  Occupational History   Not on file  Tobacco Use   Smoking status: Not on file   Smokeless tobacco: Not on file  Substance and Sexual Activity   Alcohol use: Not on file   Drug use: Not on file   Sexual activity: Not on file  Other Topics Concern   Not on file  Social History Narrative   Not on file   Social Determinants of Health   Financial Resource Strain: Not on file  Food Insecurity: Not on file   Transportation Needs: Not on file  Physical Activity: Not on file  Stress: Not on file  Social Connections: Not on file   Not on File No family history on file.       Current Outpatient Medications (Other):    escitalopram (LEXAPRO) 20 MG tablet, Take 20 mg by mouth daily.   Vitamin D, Cholecalciferol, 10 MCG (400 UNIT) CAPS, Take by mouth.   Reviewed prior external information including notes and imaging from  primary care provider As well as notes that were available from care everywhere and other healthcare systems.  Past medical history, social, surgical and family history all reviewed in electronic medical record.  No pertanent information unless stated regarding to the chief complaint.   Review of Systems:  No headache, visual changes, nausea, vomiting, diarrhea, constipation, dizziness, abdominal pain, skin rash, fevers, chills, night sweats, weight loss, swollen lymph nodes, body aches, joint swelling, chest pain, shortness of breath, mood changes. POSITIVE muscle aches  Objective  There were no vitals taken for this visit.   General: No apparent distress alert and oriented x3 mood and affect normal, dressed appropriately.  HEENT: Pupils equal, extraocular movements intact  Respiratory: Patient's speak in full sentences and does not appear short of breath  Cardiovascular: No lower extremity edema, non tender, no erythema      Impression and  Recommendations:

## 2022-05-16 ENCOUNTER — Encounter: Payer: Self-pay | Admitting: Family Medicine

## 2022-05-17 ENCOUNTER — Ambulatory Visit: Payer: BC Managed Care – PPO | Admitting: Family Medicine

## 2022-05-18 ENCOUNTER — Ambulatory Visit
Admission: RE | Admit: 2022-05-18 | Discharge: 2022-05-18 | Disposition: A | Discharge status: Home / Self Care | Payer: BC Managed Care – PPO | Source: Ambulatory Visit | Attending: Family Medicine | Admitting: Family Medicine

## 2022-05-18 DIAGNOSIS — G8929 Other chronic pain: Secondary | ICD-10-CM

## 2022-05-29 NOTE — Progress Notes (Unsigned)
Rebecca [name] [place] [address] [place] Phone: [phone] Subjective:   Rebecca [name], am serving as a scribe for Dr. Hulan Saas.  I'm seeing this patient by the request  of:  Rebecca Mires, MD  CC: Right knee follow-up and left ankle pain.  [id number]  04/13/2022 Does have some degenerative arthritis but no instability of the knee.  Due to patient's age and the severity but it is affecting daily activities I do feel advanced imaging is warranted.  Patient has failed injections, formal physical therapy, oral and topical anti-inflammatories.  Depending on the findings we will discuss further treatment options.     Patient still has the hypermobility of the ankle to a certain degree.  Patient does have tenderness to palpation we discussed with patient though that it does appear that she does have a new injury to the proximal muscular tendon juncture.  We discussed with patient and if we needed to further evaluate we do need to consider the possibility of x-rays and even MRI.  Patient would like to the x-rays but hold on the MRI. Discussed with patient about compression and home exercises.  Follow-up with me again in 6 to 8 weeks.      Update 05/30/2022 Rebecca [name] is a 47 y.o. female coming in with complaint of R knee and L ankle pain. Patient states wants to go over MRI results. Lower left leg still a problem. Feels like tendon is rolling over something. Recently was running on a treadmill when she felt that pop in her foot and her calf locked up.  MRI R knee Jan 2024 IMPRESSION: 1. No meniscal or ligamentous injury. 2. Mild medial compartment osteoarthritis. 3. Mild edema in the superolateral aspect of Hoffa's fat pad, which can be seen in the setting of patellar tendon-lateral femoral condyle friction syndrome    No past medical history on file. No past surgical history on file. Social History   Socioeconomic History   Marital  status: Married    Spouse name: Not on file   Number of children: Not on file   Years of education: Not on file   Highest education level: Not on file  Occupational History   Not on file  Tobacco Use   Smoking status: Not on file   Smokeless tobacco: Not on file  Substance and Sexual Activity   Alcohol use: Not on file   Drug use: Not on file   Sexual activity: Not on file  Other Topics Concern   Not on file  Social History Narrative   Not on file   Social Determinants of Health   Financial Resource Strain: Not on file  Food Insecurity: Not on file  Transportation Needs: Not on file  Physical Activity: Not on file  Stress: Not on file  Social Connections: Not on file   Not on File No family history on file.       Current Outpatient Medications (Other):    escitalopram (LEXAPRO) 20 MG tablet, Take 20 mg by mouth daily.   Vitamin D, Cholecalciferol, 10 MCG (400 UNIT) CAPS, Take by mouth.   Reviewed prior external information including notes and imaging from  primary care provider As well as notes that were available from care everywhere and other healthcare systems.  Past medical history, social, surgical and family history all reviewed in electronic medical record.  No pertanent information unless stated regarding to the chief complaint.   Review of Systems:  No  headache, visual changes, nausea, vomiting, diarrhea, constipation, dizziness, abdominal pain, skin rash, fevers, chills, night sweats, weight loss, swollen lymph nodes, body aches, joint swelling, chest pain, shortness of breath, mood changes. POSITIVE muscle aches  Objective  Blood pressure 124/82, pulse 70, height 5\' 5"  (1.651 m), weight 193 lb (87.5 kg), SpO2 97 %.   General: No apparent distress alert and oriented x3 mood and affect normal, dressed appropriately.  HEENT: Pupils equal, extraocular movements intact  Respiratory: Patient's speak in full sentences and does not appear short of breath   Cardiovascular: No lower extremity edema, non tender, no erythema  Left ankle does have some anterior drawer test noted.  Patient does have some weakness noted on the lateral aspect of the ankle.  Does have some limited range of motion noted at this point.  No significant swelling.  Limited muscular skeletal ultrasound was performed and interpreted by Hulan Saas, M  Limited ultrasound shows patient does have hypoechoic changes with likely retraction of the ATFL noted today.  Questionable small avulsion injury noted after the malleolus portion. Impression: Likely an acute ATFL tear with possible retraction  [address]7110; 15 additional minutes spent for Therapeutic exercises as stated in above notes.  This included exercises focusing on stretching, strengthening, with significant focus on eccentric aspects.   Long term goals include an improvement in range of motion, strength, endurance as well as avoiding reinjury. Patient's frequency would include in 1-2 times a day, 3-5 times a week for a duration of 6-12 weeks. Ankle strengthening that included:  Basic range of motion exercises to allow proper full motion at ankle Stretching of the lower leg and hamstrings  Theraband exercises for the lower leg - inversion, eversion, dorsiflexion and plantarflexion each to be completed with a theraband Balance exercises to increase proprioception Weight bearing exercises to increase strength and balance  Proper technique shown and discussed handout in great detail with ATC.  All questions were discussed and answered.     Impression and Recommendations:    The above documentation has been reviewed and is accurate and complete Lyndal Pulley, DO

## 2022-05-30 ENCOUNTER — Encounter: Payer: Self-pay | Admitting: Family Medicine

## 2022-05-30 ENCOUNTER — Ambulatory Visit (INDEPENDENT_AMBULATORY_CARE_PROVIDER_SITE_OTHER): Payer: BC Managed Care – PPO | Admitting: Family Medicine

## 2022-05-30 ENCOUNTER — Ambulatory Visit: Payer: Self-pay

## 2022-05-30 VITALS — BP 124/82 | HR 70 | Ht 65.0 in | Wt 193.0 lb

## 2022-05-30 DIAGNOSIS — M25572 Pain in left ankle and joints of left foot: Secondary | ICD-10-CM | POA: Diagnosis not present

## 2022-05-30 DIAGNOSIS — M1711 Unilateral primary osteoarthritis, right knee: Secondary | ICD-10-CM | POA: Diagnosis not present

## 2022-05-30 NOTE — Patient Instructions (Signed)
Aircast daily for 2 weeks then with exercises for 4 weeks Knee-keep working at exercises See me again in 5-6 weeks

## 2022-05-30 NOTE — Assessment & Plan Note (Signed)
Patient does have what appears to be more of a rupture of the ATFL.  Aircast and exercises given, discussed icing regimen and home exercises, discussed which activities to do and which ones to avoid.  Increase activity slowly otherwise.  Follow-up again in 6 to 8 weeks.

## 2022-07-05 NOTE — Progress Notes (Signed)
[facility] [place] Phone: [phone] Subjective:   Rebecca [name], am serving as a scribe for Dr. Hulan Saas.  I'm seeing this patient by the request  of:  Katherina Mires, MD  CC: Right knee and left ankle pain  [id number]  05/30/2022 Patient does have what appears to be more of a rupture of the ATFL.  Aircast and exercises given, discussed icing regimen and home exercises, discussed which activities to do and which ones to avoid.  Increase activity slowly otherwise.  Follow-up again in 6 to 8 weeks.      Update 07/06/2022 Rebecca [name] is a 47 y.o. female coming in with complaint of R knee and L ankle pain. Patient states knee is what it is, but the ankle still feels weak and wobbly. Wanting to know if you see any improvement. Patient has also noticed left hip fatigue she states she pretty sure its because of the way she is walking would like some suggestions to help with that.       No past medical history on file. No past surgical history on file.        Current Outpatient Medications (Other):    escitalopram (LEXAPRO) 20 MG tablet, Take 20 mg by mouth daily.   Vitamin D, Cholecalciferol, 10 MCG (400 UNIT) CAPS, Take by mouth.   Reviewed prior external information including notes and imaging from  primary care provider As well as notes that were available from care everywhere and other healthcare systems.  Past medical history, social, surgical and family history all reviewed in electronic medical record.  No pertanent information unless stated regarding to the chief complaint.   Review of Systems:  No headache, visual changes, nausea, vomiting, diarrhea, constipation, dizziness, abdominal pain, skin rash, fevers, chills, night sweats, weight loss, swollen lymph nodes, body aches, joint swelling, chest pain, shortness of breath, mood changes. POSITIVE muscle aches  Objective  Blood pressure 118/82,  pulse 71, height 5\' 5"  (1.651 m), SpO2 98 %.   General: No apparent distress alert and oriented x3 mood and affect normal, dressed appropriately.  HEENT: Pupils equal, extraocular movements intact  Respiratory: Patient's speak in full sentences and does not appear short of breath  Cardiovascular: No lower extremity edema, non tender, no erythema  Greater trochanteric area does have tenderness to palpation over the left side.  Patient does have a positive Corky Sox noted.  Patient has some mild pain with resisted flexion of the hip consistent with the hip flexor but cannot range of motion including internal rotation of the left hip especially compared to the right hip that has had postsurgical changes.  Limited muscular skeletal ultrasound was performed and interpreted by Hulan Saas, M  Limited ultrasound of patient's ankle does show the patient does have still calcific changes noted of the ATFL.  Patient's ankle mortise appears to be unremarkable, no significant hypoechoic changes though noted. Impression: Improvement but calcific changes of the ATFL   Procedure: Real-time Ultrasound Guided Injection of left  greater trochanteric bursitis secondary to patient's body habitus Device: GE Logiq Q7  Ultrasound guided injection is preferred based studies that show increased duration, increased effect, greater accuracy, decreased procedural pain, increased response rate, and decreased cost with ultrasound guided versus blind injection.  Verbal informed consent obtained.  Time-out conducted.  Noted no overlying erythema, induration, or other signs of local infection.  Skin prepped in a sterile fashion.  Local anesthesia: Topical Ethyl chloride.  With sterile technique and under real time ultrasound guidance:  Greater trochanteric area was visualized and patient's bursa was noted. A 22-gauge 3 inch needle was inserted and 4 cc of 0.5% Marcaine and 1 cc of Kenalog 40 mg/dL was injected. Pictures  taken Completed without difficulty  Pain immediately resolved suggesting accurate placement of the medication.  Advised to call if fevers/chills, erythema, induration, drainage, or persistent bleeding.   Impression: Technically successful ultrasound guided injection.   Impression and Recommendations:    The above documentation has been reviewed and is accurate and complete Lyndal Pulley, DO

## 2022-07-06 ENCOUNTER — Ambulatory Visit (INDEPENDENT_AMBULATORY_CARE_PROVIDER_SITE_OTHER): Payer: BC Managed Care – PPO | Admitting: Family Medicine

## 2022-07-06 ENCOUNTER — Encounter: Payer: Self-pay | Admitting: Family Medicine

## 2022-07-06 ENCOUNTER — Ambulatory Visit: Payer: Self-pay

## 2022-07-06 VITALS — BP 118/82 | HR 71 | Ht 65.0 in

## 2022-07-06 DIAGNOSIS — M7062 Trochanteric bursitis, left hip: Secondary | ICD-10-CM | POA: Diagnosis not present

## 2022-07-06 DIAGNOSIS — M25572 Pain in left ankle and joints of left foot: Secondary | ICD-10-CM

## 2022-07-06 NOTE — Assessment & Plan Note (Signed)
New problem, injection today, likely compensating for the ankle and having an abnormal or antalgic walk.  Patient hopefully will make significant improvements with conservative therapy including hip abductor strengthening.  Follow-up with me again in 6 to 8 weeks

## 2022-07-06 NOTE — Assessment & Plan Note (Signed)
Seems to make significant improvement noted as well.  Patient does have some calcific changes of the ATFL repair will need to continue to monitor.  Discussed icing regimen and home exercises, increase activity slowly.  Follow-up again in 6 to 8 weeks

## 2022-07-06 NOTE — Patient Instructions (Addendum)
Good to see you Exercises Injected GT today See me again in  6-8 weeks

## 2022-08-30 NOTE — Progress Notes (Signed)
[name] [address] [place] [address] Phone: [phone] Subjective:   [name], am serving as a scribe for Dr. Antoine Primas.  I'm seeing this patient by the request  of:  Macy Mis, MD  CC: Left ankle left hip  [id number]  07/06/2022 New problem, injection today, likely compensating for the ankle and having an abnormal or antalgic walk.  Patient hopefully will make significant improvements with conservative therapy including hip abductor strengthening.  Follow-up with me again in 6 to 8 weeks     Seems to make significant improvement noted as well.  Patient does have some calcific changes of the ATFL repair will need to continue to monitor.  Discussed icing regimen and home exercises, increase activity slowly.  Follow-up again in 6 to 8 weeks     Update 08/31/2022 Nialani Pruyn is a 47 y.o. female coming in with complaint of L ankle and L hip pain. Patient states that she is running again. Notices that when she runs she supinates her foot. Has been doing HEP. Has a tightness in the lateral aspect of ankle.   Injection helped L hip pain.       No past medical history on file. No past surgical history on file. Social History   Socioeconomic History   Marital status: Married    Spouse name: Not on file   Number of children: Not on file   Years of education: Not on file   Highest education level: Not on file  Occupational History   Not on file  Tobacco Use   Smoking status: Not on file   Smokeless tobacco: Not on file  Substance and Sexual Activity   Alcohol use: Not on file   Drug use: Not on file   Sexual activity: Not on file  Other Topics Concern   Not on file  Social History Narrative   Not on file   Social Determinants of Health   Financial Resource Strain: Not on file  Food Insecurity: Not on file  Transportation Needs: Not on file  Physical Activity: Not on file  Stress: Not on file  Social  Connections: Not on file   Not on File No family history on file.       Current Outpatient Medications (Other):    escitalopram (LEXAPRO) 20 MG tablet, Take 20 mg by mouth daily.   Vitamin D, Cholecalciferol, 10 MCG (400 UNIT) CAPS, Take by mouth.   Reviewed prior external information including notes and imaging from  primary care provider As well as notes that were available from care everywhere and other healthcare systems.  Past medical history, social, surgical and family history all reviewed in electronic medical record.  No pertanent information unless stated regarding to the chief complaint.   Review of Systems:  No headache, visual changes, nausea, vomiting, diarrhea, constipation, dizziness, abdominal pain, skin rash, fevers, chills, night sweats, weight loss, swollen lymph nodes, body aches, joint swelling, chest pain, shortness of breath, mood changes. POSITIVE muscle aches  Objective  Blood pressure 110/72, pulse 83, height 5\' 5"  (1.651 m), weight 188 lb (85.3 kg), SpO2 98 %.   General: No apparent distress alert and oriented x3 mood and affect normal, dressed appropriately.  HEENT: Pupils equal, extraocular movements intact  Respiratory: Patient's speak in full sentences and does not appear short of breath  Cardiovascular: No lower extremity edema, non tender, no erythema  Left hip is not tender anymore at all.  Good range  of motion.  Still instability of the left ankle noted.  Seems to be more on the lateral aspect.  Mild pain over the peroneal tendon still noted.  Limited muscular skeletal ultrasound was performed and interpreted by Antoine Primas, M  Limited ultrasound does not show any hypoechoic changes in any of the tendon sheaths.  No significant swelling noted of the ankle mortise. Impression: Relatively normal   Impression and Recommendations:     The above documentation has been reviewed and is accurate and complete Judi Saa, DO

## 2022-08-31 ENCOUNTER — Other Ambulatory Visit: Payer: Self-pay

## 2022-08-31 ENCOUNTER — Ambulatory Visit (INDEPENDENT_AMBULATORY_CARE_PROVIDER_SITE_OTHER): Payer: BC Managed Care – PPO | Admitting: Family Medicine

## 2022-08-31 ENCOUNTER — Encounter: Payer: Self-pay | Admitting: Family Medicine

## 2022-08-31 VITALS — BP 110/72 | HR 83 | Ht 65.0 in | Wt 188.0 lb

## 2022-08-31 DIAGNOSIS — M25572 Pain in left ankle and joints of left foot: Secondary | ICD-10-CM

## 2022-08-31 DIAGNOSIS — G8929 Other chronic pain: Secondary | ICD-10-CM | POA: Diagnosis not present

## 2022-08-31 DIAGNOSIS — M7672 Peroneal tendinitis, left leg: Secondary | ICD-10-CM

## 2022-08-31 NOTE — Patient Instructions (Signed)
Good to see you Wear aircast with long runs See me in 2-3 months

## 2022-08-31 NOTE — Assessment & Plan Note (Signed)
Has made improvement overall.  Does have some instability of the ankle noted though.  Discussed with patient stretching the posterior tibialis and still strengthening the lateral aspect.  Discussed the potential for bracing as well.  Patient wants to continue to use the Aircast with long runs.  Follow-up with me again 2 to 3 months

## 2022-11-09 ENCOUNTER — Ambulatory Visit: Payer: BC Managed Care – PPO | Admitting: Family Medicine

## 2022-12-07 ENCOUNTER — Ambulatory Visit: Payer: BC Managed Care – PPO | Admitting: Family Medicine

## 2024-03-23 IMAGING — MR MR ANKLE*L* W/O CM
5 series · 40 of 40 positions shown · non-contrast
Comparison: None Available.

CLINICAL DATA: Left ankle pain.  No known injury.

EXAM:
MRI OF THE LEFT ANKLE WITHOUT CONTRAST
TECHNIQUE: Multiplanar, multisequence MR imaging of the ankle was performed. No
intravenous contrast was administered.

[Series 4: T2 fat-sat · axial · 3.0mm · 0.62mm/px · z∈[-88,+41]mm · 9 of 34 slices shown (1 of 2)]
[im 1/34]
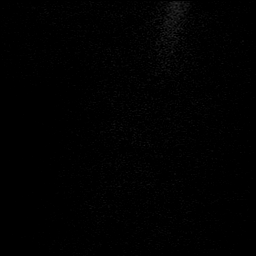
[im 5/34]
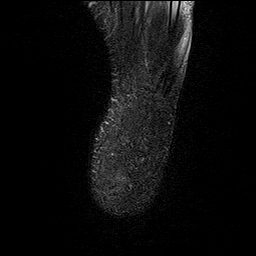
[im 9/34]
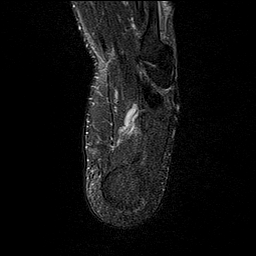
[im 13/34]
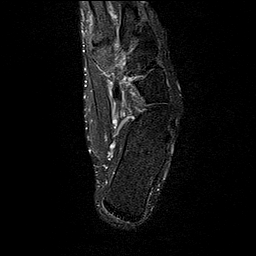
[im 17/34]
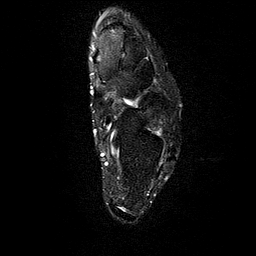
[im 21/34]
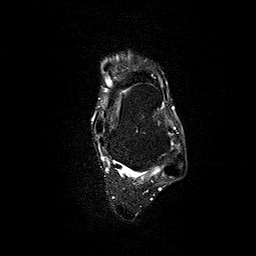
[im 25/34]
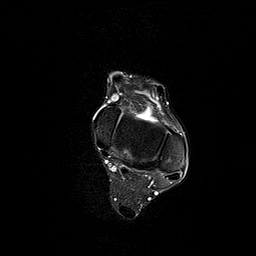
[im 29/34]
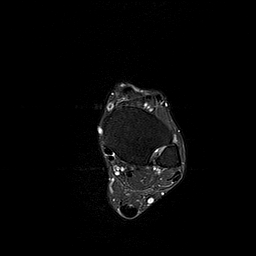
[im 34/34]
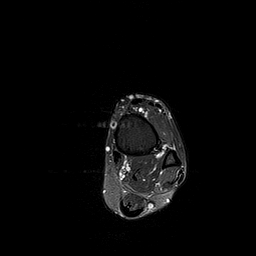

[Series 5: PD fat-sat · axial · 3.0mm · 0.62mm/px · z∈[-88,+41]mm · 9 of 34 slices shown]
[im 1/34]
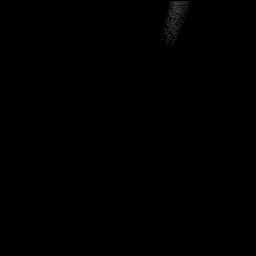
[im 5/34]
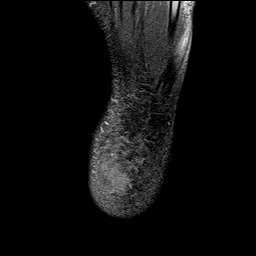
[im 9/34]
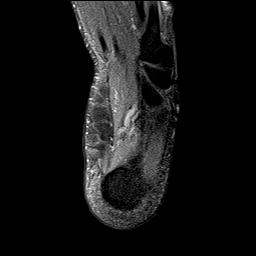
[im 13/34]
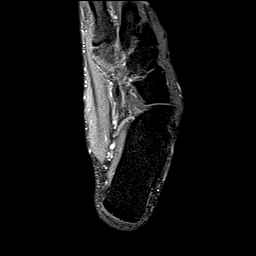
[im 17/34]
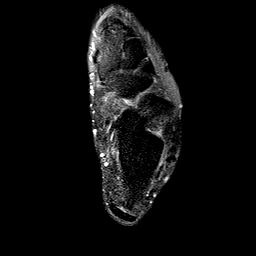
[im 21/34]
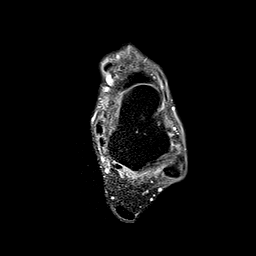
[im 25/34]
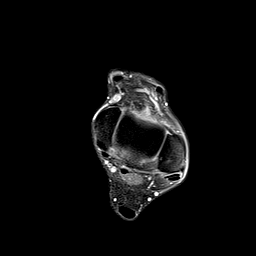
[im 29/34]
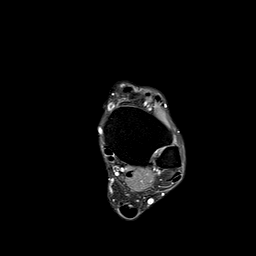
[im 34/34]
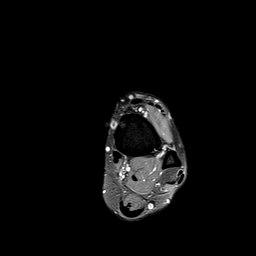

[Series 6: T1 · sagittal · 4.0mm · 0.70mm/px · 6 of 25 slices shown]
[im 1/25]
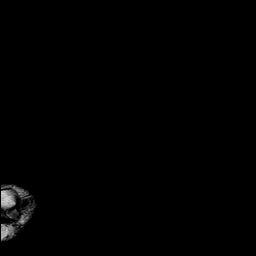
[im 5/25]
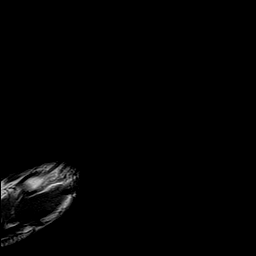
[im 10/25]
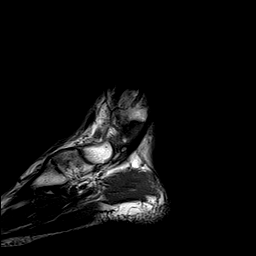
[im 15/25]
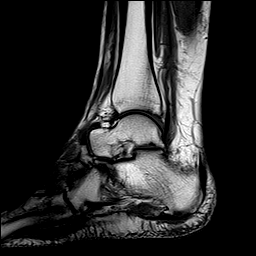
[im 20/25]
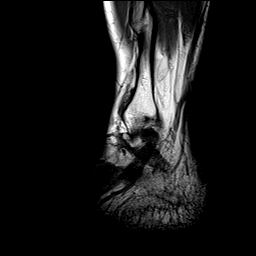
[im 25/25]
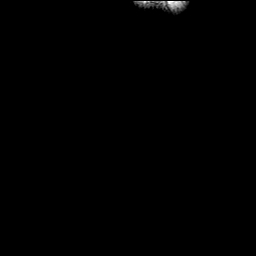

[Series 7: STIR · sagittal · 4.0mm · 0.35mm/px · 6 of 25 slices shown]
[im 1/25]
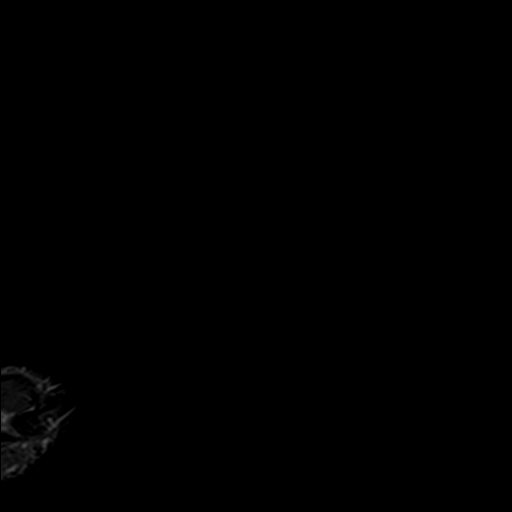
[im 5/25]
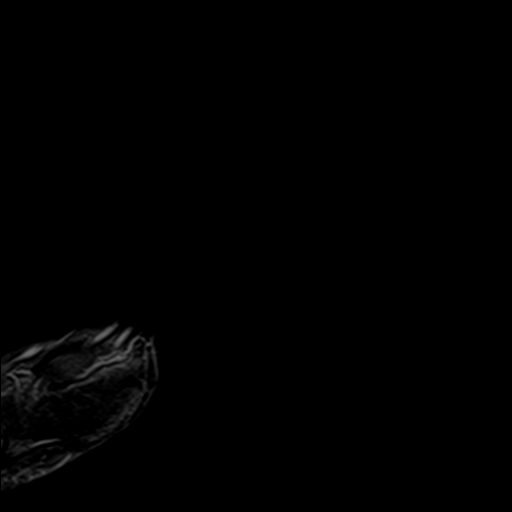
[im 10/25]
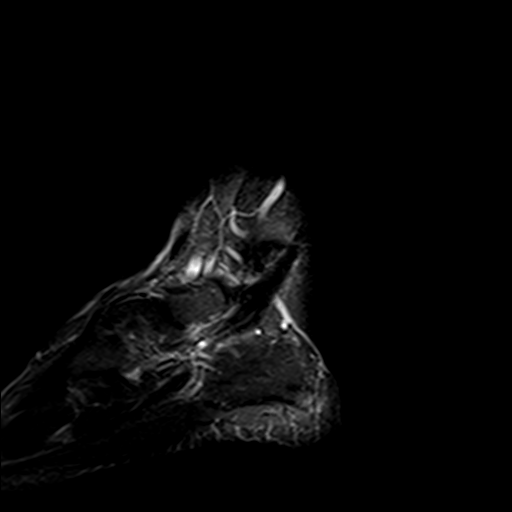
[im 15/25]
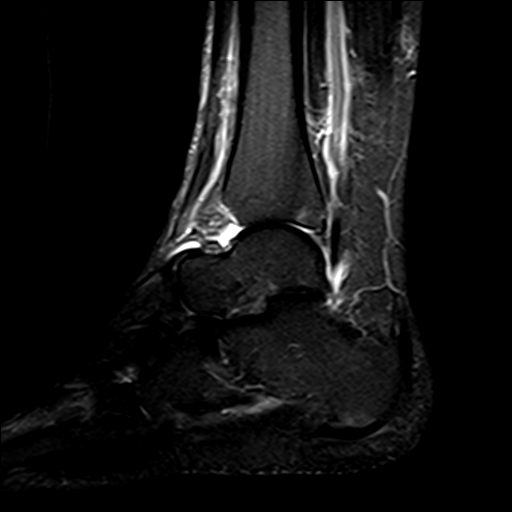
[im 20/25]
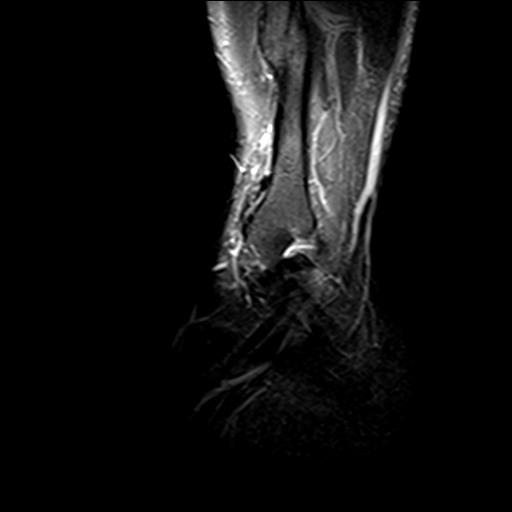
[im 25/25]
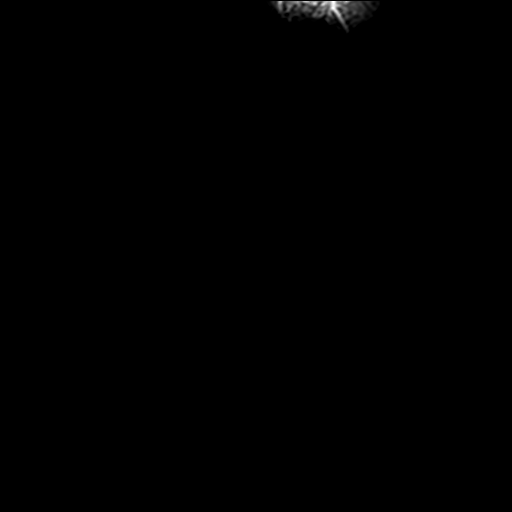

[Series 8: T2 fat-sat · coronal · 3.0mm · 0.62mm/px · 10 of 37 slices shown (2 of 2)]
[im 1/37]
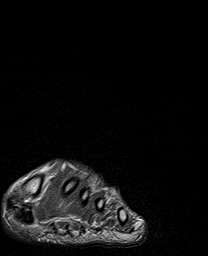
[im 5/37]
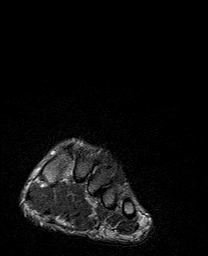
[im 9/37]
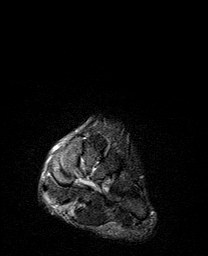
[im 13/37]
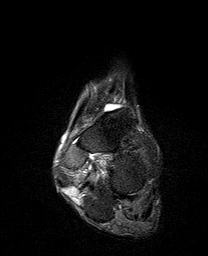
[im 17/37]
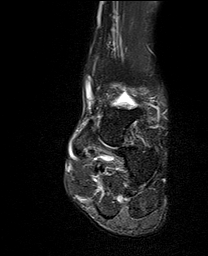
[im 21/37]
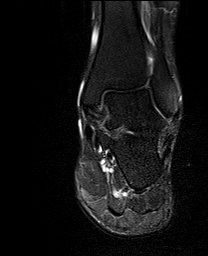
[im 25/37]
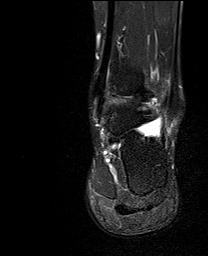
[im 29/37]
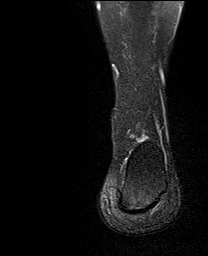
[im 33/37]
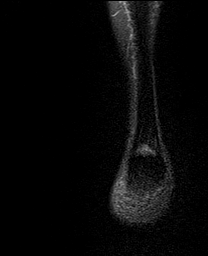
[im 37/37]
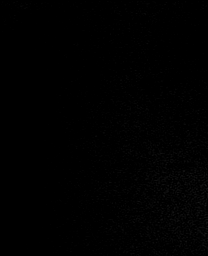

[40 of 40 positions shown; findings below may reference images not displayed]

FINDINGS: TENDONS

Peroneal: Mild tendinosis of the peroneus longus. Peroneal brevis
intact.

Posteromedial: Posterior tibial tendon intact. Flexor hallucis
longus tendon intact. Flexor digitorum longus tendon intact.

Anterior: Tibialis anterior tendon intact. Extensor hallucis longus
tendon intact Extensor digitorum longus tendon intact.

Achilles:  Intact.

Plantar Fascia: Small plantar calcaneal spur. Mild thickening of the
medial band of the plantar fascia with subcortical reactive marrow
edema at the calcaneal insertion consistent with plantar fasciitis.

LIGAMENTS

Lateral: Attenuation of the anterior talofibular ligament consistent
with prior injury without a complete tear. Calcaneofibular ligament
intact. Posterior talofibular ligament intact. Anterior and
posterior tibiofibular ligaments intact.

Medial: Deltoid ligament intact. Spring ligament intact.

CARTILAGE

Ankle Joint: Small joint effusion. Normal ankle mortise. No chondral
defect.

Subtalar Joints/Sinus Tarsi: Normal subtalar joints. Small subtalar
joint effusion. Normal sinus tarsi.

Bones: No aggressive osseous lesion. No fracture or dislocation.
Relative Maricel Crozier.

Soft Tissue: No fluid collection or hematoma. Muscles are normal
without edema or atrophy. Tarsal tunnel is normal.
IMPRESSION: 1. Mild thickening of the medial band of the plantar fascia with
subcortical reactive marrow edema at the calcaneal insertion
consistent with plantar fasciitis.
2. Mild tendinosis of the peroneus longus.
